# Patient Record
Sex: Male | Born: 1975 | Race: White | Hispanic: No | Marital: Married | Smoking: Never smoker
Health system: Southern US, Community
[De-identification: ages and names within clinical notes are randomized; demographics above are authoritative.]

## PROBLEM LIST (undated history)

## (undated) HISTORY — PX: THROAT SURGERY: SHX803

## (undated) HISTORY — PX: TONSILLECTOMY: SUR1361

## (undated) HISTORY — PX: WISDOM TOOTH EXTRACTION: SHX21

---

## 2007-11-17 ENCOUNTER — Inpatient Hospital Stay (HOSPITAL_COMMUNITY): Admission: RE | Admit: 2007-11-17 | Discharge: 2007-11-21 | Payer: Self-pay | Admitting: Psychiatry

## 2007-11-17 ENCOUNTER — Ambulatory Visit: Payer: Self-pay | Admitting: Psychiatry
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
## 2011-04-17 LAB — TSH: TSH: 3.465

## 2018-08-02 ENCOUNTER — Emergency Department (HOSPITAL_BASED_OUTPATIENT_CLINIC_OR_DEPARTMENT_OTHER)
Admission: EM | Admit: 2018-08-02 | Discharge: 2018-08-02 | Disposition: A | Discharge status: Home / Self Care | Payer: Commercial Managed Care - PPO | Attending: Emergency Medicine | Admitting: Emergency Medicine

## 2018-08-02 ENCOUNTER — Emergency Department (HOSPITAL_BASED_OUTPATIENT_CLINIC_OR_DEPARTMENT_OTHER): Payer: Commercial Managed Care - PPO

## 2018-08-02 ENCOUNTER — Encounter (HOSPITAL_BASED_OUTPATIENT_CLINIC_OR_DEPARTMENT_OTHER): Payer: Self-pay | Admitting: Emergency Medicine

## 2018-08-02 ENCOUNTER — Other Ambulatory Visit: Payer: Self-pay

## 2018-08-02 DIAGNOSIS — J069 Acute upper respiratory infection, unspecified: Secondary | ICD-10-CM | POA: Insufficient documentation

## 2018-08-02 DIAGNOSIS — R05 Cough: Secondary | ICD-10-CM | POA: Diagnosis present

## 2018-08-02 DIAGNOSIS — B9789 Other viral agents as the cause of diseases classified elsewhere: Secondary | ICD-10-CM

## 2018-08-02 MED ORDER — BENZONATATE 100 MG PO CAPS: 100.0000 mg | ORAL_CAPSULE | Freq: Three times a day (TID) | ORAL | 0 refills | Status: DC | PRN

## 2018-08-02 NOTE — ED Triage Notes (Signed)
Reports fever, cough, congestion.  Believes he has pneumonia.  Reports wife diagnosed with pneumonia 1 week ago.

## 2018-08-02 NOTE — ED Provider Notes (Signed)
[facility] EMERGENCY DEPARTMENT Provider Note   CSN: [id number] Arrival date & time: 08/02/18  1219     History   Chief Complaint Chief Complaint  Patient presents with  . Cough    HPI Miguel Serrano is a 43 y.o. male without significant past medical history, presenting to the emergency department with complaint of 1 week of cold symptoms.  Patient states he has a cough productive of yellow phlegm and nasal congestion.  He has been treating with OTC Alka-Seltzer cold and flu.  He reports a fever of 99.8 F.  His wife is also ill with similar symptoms, however was recently diagnosed with pneumonia 1 week ago.  His symptoms are not worsening, however they are not improving.  He denies associated sore throat, ear pain, difficulty breathing or swallowing.  The history is provided by the patient.    History reviewed. No pertinent past medical history.  There are no active problems to display for this patient.   History reviewed. No pertinent surgical history.      Home Medications    Prior to Admission medications   Medication Sig Start Date End Date Taking? Authorizing Provider  benzonatate (TESSALON) 100 MG capsule Take 1 capsule (100 mg total) by mouth 3 (three) times daily as needed for cough. 08/02/18   Fedrick Cefalu, [place] N, PA-C    Family History History reviewed. No pertinent family history.  Social History Social History   Tobacco Use  . Smoking status: Not on file  Substance Use Topics  . Alcohol use: Not on file  . Drug use: Not on file     Allergies   Patient has no known allergies.   Review of Systems Review of Systems  HENT: Positive for congestion. Negative for ear pain, sore throat, trouble swallowing and voice change.   Respiratory: Positive for cough. Negative for shortness of breath.      Physical Exam Updated Vital Signs BP (!) 170/98 (BP Location: Right Arm)   Pulse 82   Temp 98.7 F (37.1 C) (Oral)   Resp 18   Ht 6\' 3"   (1.905 m)   Wt 86.2 kg   SpO2 100%   BMI 23.75 kg/m   Physical Exam Vitals signs and nursing note reviewed.  Constitutional:      Appearance: He is well-developed. He is not ill-appearing.  HENT:     Head: Normocephalic and atraumatic.     Right Ear: Tympanic membrane and ear canal normal.     Left Ear: Tympanic membrane and ear canal normal.     Mouth/Throat:     Mouth: Mucous membranes are moist.     Pharynx: Oropharynx is clear. No oropharyngeal exudate.     Comments: Tolerating secretions Eyes:     Conjunctiva/sclera: Conjunctivae normal.  Neck:     Musculoskeletal: Normal range of motion and neck supple. No neck rigidity.  Cardiovascular:     Rate and Rhythm: Normal rate and regular rhythm.  Pulmonary:     Effort: Pulmonary effort is normal.     Breath sounds: Normal breath sounds.  Lymphadenopathy:     Cervical: No cervical adenopathy.  Neurological:     Mental Status: He is alert.  Psychiatric:        Mood and Affect: Mood normal.        Behavior: Behavior normal.      ED Treatments / Results  Labs (all labs ordered are listed, but only abnormal results are displayed) Labs Reviewed - No data  to display  EKG None  Radiology Dg Chest 2 View  Result Date: 08/02/2018 CLINICAL DATA:  Cough for 1 week.  Fever, chills, and weakness. EXAM: CHEST - 2 VIEW COMPARISON:  12/02/2015 FINDINGS: The heart size and mediastinal contours are within normal limits. Both lungs are clear. The visualized skeletal structures are unremarkable. IMPRESSION: No active cardiopulmonary disease. Electronically Signed   By: Myles RosenthalJohn  Stahl M.D.   On: 08/02/2018 12:52    Procedures Procedures (including critical care time)  Medications Ordered in ED Medications - No data to display   Initial Impression / Assessment and Plan / ED Course  I have reviewed the triage vital signs and the nursing notes.  Pertinent labs & imaging results that were available during my care of the patient were  reviewed by me and considered in my medical decision making (see chart for details).     Patients symptoms are consistent with URI, likely viral etiology. Afebrile, tolerating secretions.  Lungs clear to auscultation bilaterally. CXR negative for acute infiltrate.  Discussed that antibiotics are not indicated for viral infections. Pt will be discharged with symptomatic treatment.  Recommend PCP follow-up to recheck blood pressure.  Encourage patient to avoid guaifenesin as this can raise his blood pressure.  Verbalizes understanding and is agreeable with plan. Pt is hemodynamically stable & in NAD prior to dc.  Discussed results, findings, treatment and follow up. Patient advised of return precautions. Patient verbalized understanding and agreed with plan.  Final Clinical Impressions(s) / ED Diagnoses   Final diagnoses:  Viral URI with cough    ED Discharge Orders         Ordered    benzonatate (TESSALON) 100 MG capsule  3 times daily PRN     08/02/18 1356           Manpreet Kemmer, [place] N, [place]-C 08/02/18 1405    Pricilla LovelessGoldston, [place], MD 08/03/18 [phone]

## 2018-08-02 NOTE — Discharge Instructions (Addendum)
Your chest x-ray is normal today.   Avoid cold medications that contain guaifenesin, as this may be contributing to your high blood pressure today. You can take Tessalon every 8 hours as needed for cough. You can take tylenol or ibuprofen as needed for sore throat or fever.  Drink plenty of water.  Use saline nasal spray for congestion. Follow up with your primary care provider to discuss your visit today, as well as to recheck your blood pressure. Return to the ER for inability to swallow liquids, difficulty breathing, or new or worsening symptoms.

## 2018-08-02 NOTE — ED Notes (Signed)
Patient verbalizes understanding of discharge instructions. Opportunity for questioning and answers were provided. Armband removed by staff, pt discharged from ED.  

## 2018-08-04 ENCOUNTER — Ambulatory Visit: Payer: Self-pay | Admitting: Cardiology

## 2018-08-18 ENCOUNTER — Ambulatory Visit (INDEPENDENT_AMBULATORY_CARE_PROVIDER_SITE_OTHER): Payer: Commercial Managed Care - PPO | Admitting: Cardiology

## 2018-08-18 ENCOUNTER — Encounter: Payer: Self-pay | Admitting: Cardiology

## 2018-08-18 VITALS — BP 168/84 | HR 83 | Ht 75.0 in | Wt 192.0 lb

## 2018-08-18 DIAGNOSIS — I10 Essential (primary) hypertension: Secondary | ICD-10-CM | POA: Insufficient documentation

## 2018-08-18 DIAGNOSIS — R011 Cardiac murmur, unspecified: Secondary | ICD-10-CM

## 2018-08-18 MED ORDER — AMLODIPINE BESYLATE 5 MG PO TABS: 5.0000 mg | ORAL_TABLET | Freq: Every day | ORAL | 1 refills | Status: DC

## 2018-08-18 NOTE — Progress Notes (Signed)
Cardiology Office Note:    Date:  08/18/2018   ID:  Miguel Serrano, DOB [birth year], MRN [id number]  PCP:  Baldo Ash, FNP  Cardiologist:  Garwin Brothers, MD   Referring MD: Baldo Ash, FNP    ASSESSMENT:    1. Essential hypertension   2. Cardiac murmur    PLAN:    In order of problems listed above:  1. Primary prevention stressed with the patient.  Importance of compliance with diet and medication stressed and he vocalized understanding.  Currently is on no medications for blood pressure.  Salt intake issues were discussed.  I told him to keep a track of his blood pressures at home 2 or 3 times a day and bring [place] those readings 2 to 3 weeks when he comes to see [place] in follow-up. 2. Echocardiogram will be done to assess murmur heard on auscultation. 3. Patient will be initiated on amlodipine 5 mg daily.  Benefits and potential risks explained.  Were trying to get records from primary care doctor as we have not received any yet.   Medication Adjustments/Labs and Tests Ordered: Current medicines are reviewed at length with the patient today.  Concerns regarding medicines are outlined above.  Orders Placed This Encounter  Procedures  . ECHOCARDIOGRAM COMPLETE   Meds ordered this encounter  Medications  . amLODipine (NORVASC) 5 MG tablet    Sig: Take 1 tablet (5 mg total) by mouth daily.    Dispense:  30 tablet    Refill:  1     History of Present Illness:    Miguel Serrano is a 43 y.o. male who is being seen today for the evaluation of essential hypertension at the request of Baldo Ash, FNP.  Patient is a pleasant 43 year old male.  He has past medical history that is not very significant.  He went to his primary care physician and was told to have high blood pressure and referred here.  He leads a sedentary lifestyle.  He does not give any history of chest pain orthopnea or PND.  He does not exercise on a regular basis.  At the time of my evaluation, the  patient is alert awake oriented and in no distress.  History reviewed. No pertinent past medical history.  Past Surgical History:  Procedure Laterality Date  . THROAT SURGERY    . TONSILLECTOMY    . WISDOM TOOTH EXTRACTION      Current Medications: Current Meds  Medication Sig  . benzonatate (TESSALON) 100 MG capsule Take 1 capsule (100 mg total) by mouth 3 (three) times daily as needed for cough.  Marland Kitchen buPROPion (WELLBUTRIN XL) 150 MG 24 hr tablet Take 1 tablet by mouth daily.  . busPIRone (BUSPAR) 10 MG tablet Take 1 tablet by mouth 2 (two) times daily.  . propranolol (INDERAL) 40 MG tablet Take 1 tablet by mouth 2 (two) times daily.  . SUMAtriptan (IMITREX) 50 MG tablet Take 50 mg by mouth every 2 (two) hours as needed for migraine. May repeat in 2 hours if headache persists or recurs.     Allergies:   Patient has no known allergies.   Social History   Socioeconomic History  . Marital status: Married    Spouse name: Not on file  . Number of children: Not on file  . Years of education: Not on file  . Highest education level: Not on file  Occupational History  . Not on file  Social Needs  . Financial resource strain: Not  on file  . Food insecurity:    Worry: Not on file    Inability: Not on file  . Transportation needs:    Medical: Not on file    Non-medical: Not on file  Tobacco Use  . Smoking status: Never Smoker  . Smokeless tobacco: Never Used  Substance and Sexual Activity  . Alcohol use: Not on file  . Drug use: Not on file  . Sexual activity: Not on file  Lifestyle  . Physical activity:    Days per week: Not on file    Minutes per session: Not on file  . Stress: Not on file  Relationships  . Social connections:    Talks on phone: Not on file    Gets together: Not on file    Attends religious service: Not on file    Active member of club or organization: Not on file    Attends meetings of clubs or organizations: Not on file    Relationship status: Not  on file  Other Topics Concern  . Not on file  Social History Narrative  . Not on file     Family History: The patient's family history is not on file.  ROS:   Please see the history of present illness.    All other systems reviewed and are negative.  EKGs/Labs/Other Studies Reviewed:    The following studies were reviewed today: EKG reveals sinus rhythm and nonspecific ST-T changes   Recent Labs: No results found for requested labs within last 8760 hours.  Recent Lipid Panel No results found for: CHOL, TRIG, HDL, CHOLHDL, VLDL, LDLCALC, LDLDIRECT  Physical Exam:    VS:  BP (!) 168/84 (BP Location: Right Arm, Patient Position: Sitting, Cuff Size: Normal)   Pulse 83   Ht 6\' 3"  (1.905 m)   Wt 192 lb (87.1 kg)   SpO2 98%   BMI 24.00 kg/m     Wt Readings from Last 3 Encounters:  08/18/18 192 lb (87.1 kg)  08/02/18 190 lb (86.2 kg)     GEN: Patient is in no acute distress HEENT: Normal NECK: No JVD; No carotid bruits LYMPHATICS: No lymphadenopathy CARDIAC: S1 S2 regular, 2/6 systolic murmur at the apex. RESPIRATORY:  Clear to auscultation without rales, wheezing or rhonchi  ABDOMEN: Soft, non-tender, non-distended MUSCULOSKELETAL:  No edema; No deformity  SKIN: Warm and dry NEUROLOGIC:  Alert and oriented x 3 PSYCHIATRIC:  Normal affect    Signed, Garwin Brothers, MD  08/18/2018 4:25 PM    [facility]

## 2018-08-18 NOTE — Addendum Note (Signed)
Addended by: Carren Rang on: 08/18/2018 04:47 PM   Modules accepted: Orders

## 2018-08-18 NOTE — Patient Instructions (Addendum)
Medication Instructions:  Your physician has recommended you make the following change in your medication:   START:  Amlodipine 5mg  one tablet daily.     If you need a refill on your cardiac medications before your next appointment, please call your pharmacy.    Your physician has requested that you regularly monitor and record your blood pressure readings at home. Please use the same machine at the same time of day to check your readings and record them to bring to your follow-up visit.   Lab work: NONE If you have labs (blood work) drawn today and your tests are completely normal, you will receive your results only by: Marland Kitchen. MyChart Message (if you have MyChart) OR . A paper copy in the mail If you have any lab test that is abnormal or we need to change your treatment, we will call you to review the results.  Testing/Procedures: Your physician has requested that you have an echocardiogram. Echocardiography is a painless test that uses sound waves to create images of your heart. It provides your doctor with information about the size and shape of your heart and how well your heart's chambers and valves are working. This procedure takes approximately one hour. There are no restrictions for this procedure.    Follow-Up: [facility], you and your health needs are our priority.  As part of our continuing mission to provide you with exceptional heart care, we have created designated Provider Care Teams.  These Care Teams include your primary Cardiologist (physician) and Advanced Practice Providers (APPs -  Physician Assistants and Nurse Practitioners) who all work together to provide you with the care you need, when you need it. You will need a follow up appointment in 2 - 3 weeks with Dr Tomie Chinaevankar.    Amlodipine tablets What is this medicine? AMLODIPINE (am LOE di peen) is a calcium-channel blocker. It affects the amount of calcium found in your heart and muscle cells. This relaxes your  blood vessels, which can reduce the amount of work the heart has to do. This medicine is used to lower high blood pressure. It is also used to prevent chest pain. This medicine may be used for other purposes; ask your health care provider or pharmacist if you have questions. COMMON BRAND NAME(S): Norvasc What should I tell my health care provider before I take this medicine? They need to know if you have any of these conditions: -heart disease -liver disease -an unusual or allergic reaction to amlodipine, other medicines, foods, dyes, or preservatives -pregnant or trying to get pregnant -breast-feeding How should I use this medicine? Take this medicine by mouth with a glass of water. Follow the directions on the prescription label. You can take it with or without food. If it upsets your stomach, take it with food. Take your medicine at regular intervals. Do not take it more often than directed. Do not stop taking except on your doctor's advice. Talk to your pediatrician regarding the use of this medicine in children. While this drug may be prescribed for children as young as 6 years for selected conditions, precautions do apply. Patients over 45 years of age may have a stronger reaction and need a smaller dose. Overdosage: If you think you have taken too much of this medicine contact a poison control center or emergency room at once. NOTE: This medicine is only for you. Do not share this medicine with others. What if I miss a dose? If you miss a dose, take it  as soon as you can. If it is almost time for your next dose, take only that dose. Do not take double or extra doses. What may interact with this medicine? Do not take this medicine with any of the following medications: -tranylcypromine This medicine may also interact with the following medications: -clarithromycin -cyclosporine -diltiazem -itraconazole -simvastatin -tacrolimus This list may not describe all possible interactions.  Give your health care provider a list of all the medicines, herbs, non-prescription drugs, or dietary supplements you use. Also tell them if you smoke, drink alcohol, or use illegal drugs. Some items may interact with your medicine. What should I watch for while using this medicine? Visit your healthcare professional for regular checks on your progress. Check your blood pressure as directed. Ask your healthcare professional what your blood pressure should be and when you should contact him or her. Do not treat yourself for coughs, colds, or pain while you are using this medicine without asking your healthcare professional for advice. Some medicines may increase your blood pressure. You may get dizzy. Do not drive, use machinery, or do anything that needs mental alertness until you know how this medicine affects you. Do not stand or sit up quickly, especially if you are an older patient. This reduces the risk of dizzy or fainting spells. Avoid alcoholic drinks; they can make you dizzier. What side effects may I notice from receiving this medicine? Side effects that you should report to your doctor or health care professional as soon as possible: -allergic reactions like skin rash, itching or hives; swelling of the face, lips, or tongue -fast, irregular heartbeat -signs and symptoms of low blood pressure like dizziness; feeling faint or lightheaded, falls; unusually weak or tired -swelling of ankles, feet, hands Side effects that usually do not require medical attention (report these to your doctor or health care professional if they continue or are bothersome): -dry mouth -facial flushing -headache -stomach pain -tiredness This list may not describe all possible side effects. Call your doctor for medical advice about side effects. You may report side effects to FDA at 1-800-FDA-1088. Where should I keep my medicine? Keep out of the reach of children. Store at room temperature between 59 and 86  degrees F (15 and 30 degrees C). Throw away any unused medicine after the expiration date. NOTE: This sheet is a summary. It may not cover all possible information. If you have questions about this medicine, talk to your doctor, pharmacist, or health care provider.  2019 Elsevier/Gold Standard (2018-01-31 15:07:10)

## 2018-08-25 ENCOUNTER — Other Ambulatory Visit: Payer: Commercial Managed Care - PPO

## 2018-08-25 ENCOUNTER — Ambulatory Visit (INDEPENDENT_AMBULATORY_CARE_PROVIDER_SITE_OTHER): Payer: Commercial Managed Care - PPO

## 2018-08-25 DIAGNOSIS — I10 Essential (primary) hypertension: Secondary | ICD-10-CM | POA: Diagnosis not present

## 2018-08-25 DIAGNOSIS — R011 Cardiac murmur, unspecified: Secondary | ICD-10-CM | POA: Diagnosis not present

## 2018-08-25 NOTE — Progress Notes (Signed)
Complete echocardiogram has been performed.  Jimmy Koleman Marling RDCS, RVT 

## 2018-08-25 NOTE — Progress Notes (Deleted)
Complete echocardiogram has been performed.  Jimmy Kieran Arreguin RDCS, RVT 

## 2018-09-02 ENCOUNTER — Encounter: Payer: Self-pay | Admitting: Cardiology

## 2018-09-02 ENCOUNTER — Ambulatory Visit (INDEPENDENT_AMBULATORY_CARE_PROVIDER_SITE_OTHER): Payer: Commercial Managed Care - PPO | Admitting: Cardiology

## 2018-09-02 VITALS — BP 140/72 | HR 103 | Ht 75.0 in | Wt 193.0 lb

## 2018-09-02 DIAGNOSIS — I10 Essential (primary) hypertension: Secondary | ICD-10-CM

## 2018-09-02 NOTE — Progress Notes (Signed)
Cardiology Office Note:    Date:  09/02/2018   ID:  Miguel Serrano, DOB [birth year], MRN [id number]  PCP:  Baldo AshMartin, Michael, FNP  Cardiologist:  Garwin Brothersajan R Husna Krone, MD   Referring MD: Baldo AshMartin, Michael, FNP    ASSESSMENT:    1. Essential hypertension    PLAN:    In order of problems listed above:  1. Primary prevention stressed with the patient.  Importance of compliance with diet and medication stressed and he vocalized understanding.  I advised him to continue a low-salt diet and a healthy lifestyle including brisk walking half an hour daily at least 5 times a week.  With this he has no symptoms.  His lipids will be followed by his primary care physician. 2. Patient will be seen in follow-up appointment in 6 months or earlier if the patient has any concerns    Medication Adjustments/Labs and Tests Ordered: Current medicines are reviewed at length with the patient today.  Concerns regarding medicines are outlined above.  No orders of the defined types were placed in this encounter.  No orders of the defined types were placed in this encounter.    No chief complaint on file.    History of Present Illness:    Miguel Serrano is a 43 y.o. male.  He was evaluated by me for essential hypertension.  He denies any problems at the care of activities of daily living.  No chest pain orthopnea the patient mentions to me that his blood pressure is much better now and he is keeping a track of it.  History reviewed. No pertinent past medical history.  Past Surgical History:  Procedure Laterality Date  . THROAT SURGERY    . TONSILLECTOMY    . WISDOM TOOTH EXTRACTION      Current Medications: Current Meds  Medication Sig  . amLODipine (NORVASC) 5 MG tablet Take 1 tablet (5 mg total) by mouth daily.  . benzonatate (TESSALON) 100 MG capsule Take 1 capsule (100 mg total) by mouth 3 (three) times daily as needed for cough.  Marland Kitchen. buPROPion (WELLBUTRIN XL) 150 MG 24 hr tablet Take 1 tablet by  mouth daily.  . busPIRone (BUSPAR) 10 MG tablet Take 1 tablet by mouth 2 (two) times daily.  . SUMAtriptan (IMITREX) 50 MG tablet Take 50 mg by mouth every 2 (two) hours as needed for migraine. May repeat in 2 hours if headache persists or recurs.     Allergies:   Patient has no known allergies.   Social History   Socioeconomic History  . Marital status: Married    Spouse name: Not on file  . Number of children: Not on file  . Years of education: Not on file  . Highest education level: Not on file  Occupational History  . Not on file  Social Needs  . Financial resource strain: Not on file  . Food insecurity:    Worry: Not on file    Inability: Not on file  . Transportation needs:    Medical: Not on file    Non-medical: Not on file  Tobacco Use  . Smoking status: Never Smoker  . Smokeless tobacco: Never Used  Substance and Sexual Activity  . Alcohol use: Not on file  . Drug use: Not on file  . Sexual activity: Not on file  Lifestyle  . Physical activity:    Days per week: Not on file    Minutes per session: Not on file  . Stress: Not on file  Relationships  . Social connections:    Talks on phone: Not on file    Gets together: Not on file    Attends religious service: Not on file    Active member of club or organization: Not on file    Attends meetings of clubs or organizations: Not on file    Relationship status: Not on file  Other Topics Concern  . Not on file  Social History Narrative  . Not on file     Family History: The patient's family history is not on file.  ROS:   Please see the history of present illness.    All other systems reviewed and are negative.  EKGs/Labs/Other Studies Reviewed:    The following studies were reviewed today: I discussed my findings with the patient of the echocardiogram at length.  It looked unremarkable.   Recent Labs: No results found for requested labs within last 8760 hours.  Recent Lipid Panel No results found  for: CHOL, TRIG, HDL, CHOLHDL, VLDL, LDLCALC, LDLDIRECT  Physical Exam:    VS:  BP 140/72 (BP Location: Right Arm, Patient Position: Sitting, Cuff Size: Normal)   Pulse (!) 103   Ht 6\' 3"  (1.905 m)   Wt 193 lb (87.5 kg)   SpO2 96%   BMI 24.12 kg/m     Wt Readings from Last 3 Encounters:  09/02/18 193 lb (87.5 kg)  08/18/18 192 lb (87.1 kg)  08/02/18 190 lb (86.2 kg)     GEN: Patient is in no acute distress HEENT: Normal NECK: No JVD; No carotid bruits LYMPHATICS: No lymphadenopathy CARDIAC: Hear sounds regular, 2/6 systolic murmur at the apex. RESPIRATORY:  Clear to auscultation without rales, wheezing or rhonchi  ABDOMEN: Soft, non-tender, non-distended MUSCULOSKELETAL:  No edema; No deformity  SKIN: Warm and dry NEUROLOGIC:  Alert and oriented x 3 PSYCHIATRIC:  Normal affect   Signed, Garwin Brothersajan R Joanette Silveria, MD  09/02/2018 4:50 PM    [facility]

## 2018-09-02 NOTE — Patient Instructions (Signed)
Medication Instructions:  Your physician recommends that you continue on your current medications as directed. Please refer to the Current Medication list given to you today.  If you need a refill on your cardiac medications before your next appointment, please call your pharmacy.   Lab work: None If you have labs (blood work) drawn today and your tests are completely normal, you will receive your results only by: . MyChart Message (if you have MyChart) OR . A paper copy in the mail If you have any lab test that is abnormal or we need to change your treatment, we will call you to review the results.  Testing/Procedures: None  Follow-Up: At [facility], you and your health needs are our priority.  As part of our continuing mission to provide you with exceptional heart care, we have created designated Provider Care Teams.  These Care Teams include your primary Cardiologist (physician) and Advanced Practice Providers (APPs -  Physician Assistants and Nurse Practitioners) who all work together to provide you with the care you need, when you need it. You will need a follow up appointment in 6 months.  Please call our office 2 months in advance to schedule this appointment.  You may see No primary care provider on file. or another member of our [facility] Provider Team in [place]: Robert Krasowski, MD . Brian Munley, MD  Any Other Special Instructions Will Be Listed Below (If Applicable).    

## 2019-07-03 ENCOUNTER — Encounter: Payer: Self-pay | Admitting: Cardiology

## 2019-07-03 ENCOUNTER — Ambulatory Visit (INDEPENDENT_AMBULATORY_CARE_PROVIDER_SITE_OTHER): Payer: Commercial Managed Care - PPO | Admitting: Cardiology

## 2019-07-03 ENCOUNTER — Other Ambulatory Visit: Payer: Self-pay

## 2019-07-03 VITALS — BP 136/78 | HR 89 | Ht 75.0 in | Wt 190.2 lb

## 2019-07-03 DIAGNOSIS — I10 Essential (primary) hypertension: Secondary | ICD-10-CM

## 2019-07-03 NOTE — Patient Instructions (Signed)
Medication Instructions:  Your physician recommends that you continue on your current medications as directed. Please refer to the Current Medication list given to you today.  *If you need a refill on your cardiac medications before your next appointment, please call your pharmacy*  Lab Work: Your physician recommends that you return for lab work today: bmp, cbc, tsh, lft, lipids   If you have labs (blood work) drawn today and your tests are completely normal, you will receive your results only by: Marland Kitchen MyChart Message (if you have MyChart) OR . A paper copy in the mail If you have any lab test that is abnormal or we need to change your treatment, we will call you to review the results.  Testing/Procedures: None.   Follow-Up: At [facility], you and your health needs are our priority.  As part of our continuing mission to provide you with exceptional heart care, we have created designated Provider Care Teams.  These Care Teams include your primary Cardiologist (physician) and Advanced Practice Providers (APPs -  Physician Assistants and Nurse Practitioners) who all work together to provide you with the care you need, when you need it.  Your next appointment:   1 year(s)  The format for your next appointment:   In Person  Provider:   Jyl Heinz, MD  Other Instructions

## 2019-07-03 NOTE — Progress Notes (Signed)
Cardiology Office Note:    Date:  07/03/2019   ID:  Miguel Serrano, DOB October 26, 1975, MRN [id number]  PCP:  Baldo Ash, FNP  Cardiologist:  Garwin Brothers, MD   Referring MD: Baldo Ash, FNP    ASSESSMENT:    No diagnosis found. PLAN:    In order of problems listed above:  1. Elevated blood pressure without a diagnosis of hypertension: I discussed my findings with the patient extensively.  He has put some lifestyle modification in place and this is helped his blood pressure.  I explained to him the importance of regular exercise and he verbalized understanding.  Also mentioned about salt issues and diet.  He promises to comply with it.  He will be back in the next few days for complete blood work including fasting lipids.  He has not had that for the past several months.  At the time of my evaluation, the patient is alert awake oriented and in no distress. 2. Patient will be seen in follow-up appointment in 12 months or earlier if the patient has any concerns    Medication Adjustments/Labs and Tests Ordered: Current medicines are reviewed at length with the patient today.  Concerns regarding medicines are outlined above.  No orders of the defined types were placed in this encounter.  No orders of the defined types were placed in this encounter.    Chief Complaint  Patient presents with  . Follow-up     History of Present Illness:    Miguel Serrano is a 43 y.o. male.  Patient was evaluated by me for essential hypertension.  He denies any problems at this time and takes care of activities of daily living.  No chest pain orthopnea or PND.  He leads a sedentary lifestyle.  At the time of my evaluation, the patient is alert awake oriented and in no distress.  No past medical history on file.  Past Surgical History:  Procedure Laterality Date  . THROAT SURGERY    . TONSILLECTOMY    . WISDOM TOOTH EXTRACTION      Current Medications: No outpatient medications  have been marked as taking for the 07/03/19 encounter (Office Visit) with Oronde Hallenbeck, Aundra Dubin, MD.     Allergies:   Patient has no known allergies.   Social History   Socioeconomic History  . Marital status: Married    Spouse name: Not on file  . Number of children: Not on file  . Years of education: Not on file  . Highest education level: Not on file  Occupational History  . Not on file  Tobacco Use  . Smoking status: Never Smoker  . Smokeless tobacco: Never Used  Substance and Sexual Activity  . Alcohol use: Yes    Comment: every once in a while  . Drug use: Never  . Sexual activity: Not on file  Other Topics Concern  . Not on file  Social History Narrative  . Not on file   Social Determinants of Health   Financial Resource Strain:   . Difficulty of Paying Living Expenses: Not on file  Food Insecurity:   . Worried About Programme researcher, broadcasting/film/video in the Last Year: Not on file  . Ran Out of Food in the Last Year: Not on file  Transportation Needs:   . Lack of Transportation (Medical): Not on file  . Lack of Transportation (Non-Medical): Not on file  Physical Activity:   . Days of Exercise per Week: Not on file  .  Minutes of Exercise per Session: Not on file  Stress:   . Feeling of Stress : Not on file  Social Connections:   . Frequency of Communication with Friends and Family: Not on file  . Frequency of Social Gatherings with Friends and Family: Not on file  . Attends Religious Services: Not on file  . Active Member of Clubs or Organizations: Not on file  . Attends Archivist Meetings: Not on file  . Marital Status: Not on file     Family History: The patient's family history includes Healthy in his father.  ROS:   Please see the history of present illness.    All other systems reviewed and are negative.  EKGs/Labs/Other Studies Reviewed:    The following studies were reviewed today: IMPRESSIONS    1. The left ventricle has normal systolic  function of 50-93%. The cavity size is normal. There is borderline left ventricular wall thickness. Echo evidence of impaired relaxation diastolic filling patterns.  2. Normal left atrial size.  3. Normal right atrial size.  4. Normal tricuspid valve.  5. Pulmonic valve regurgitation is mild by color flow Doppler.  6. No atrial level shunt detected by color flow Doppler.   Recent Labs: No results found for requested labs within last 8760 hours.  Recent Lipid Panel No results found for: CHOL, TRIG, HDL, CHOLHDL, VLDL, LDLCALC, LDLDIRECT  Physical Exam:    VS:  BP 136/78   Pulse 89   Ht 6\' 3"  (1.905 m)   Wt 190 lb 3.2 oz (86.3 kg)   SpO2 96%   BMI 23.77 kg/m     Wt Readings from Last 3 Encounters:  07/03/19 190 lb 3.2 oz (86.3 kg)  09/02/18 193 lb (87.5 kg)  08/18/18 192 lb (87.1 kg)     GEN: Patient is in no acute distress HEENT: Normal NECK: No JVD; No carotid bruits LYMPHATICS: No lymphadenopathy CARDIAC: Hear sounds regular, 2/6 systolic murmur at the apex. RESPIRATORY:  Clear to auscultation without rales, wheezing or rhonchi  ABDOMEN: Soft, non-tender, non-distended MUSCULOSKELETAL:  No edema; No deformity  SKIN: Warm and dry NEUROLOGIC:  Alert and oriented x 3 PSYCHIATRIC:  Normal affect   Signed, Jenean Lindau, MD  07/03/2019 4:24 PM    [facility]

## 2019-12-12 IMAGING — CR DG CHEST 2V
2 series · 2 of 2 positions shown · non-contrast
Comparison: 12/02/2015

CLINICAL DATA: Cough for 1 week.  Fever, chills, and weakness.

EXAM:
CHEST - 2 VIEW

[w chest pa]
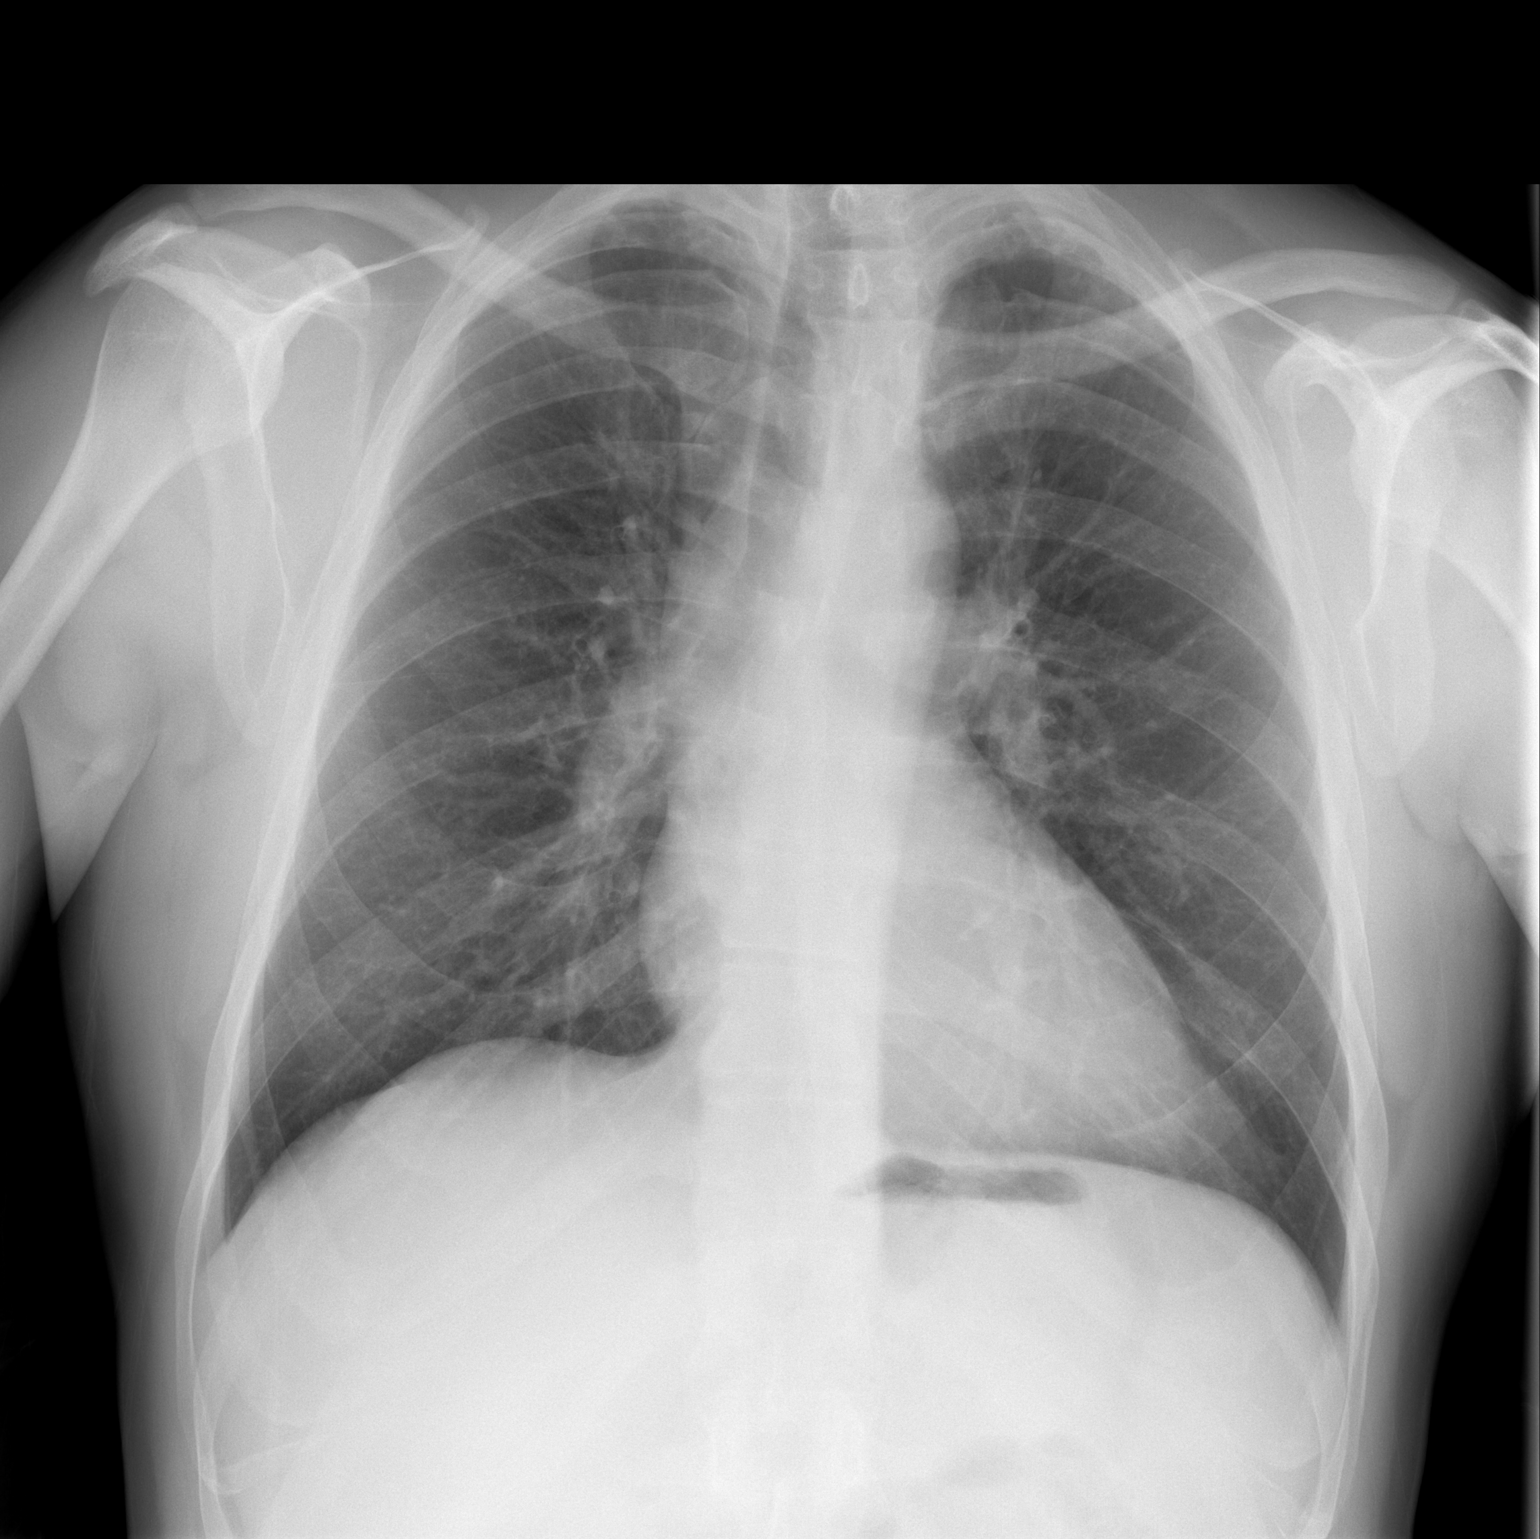

[w chest lat]
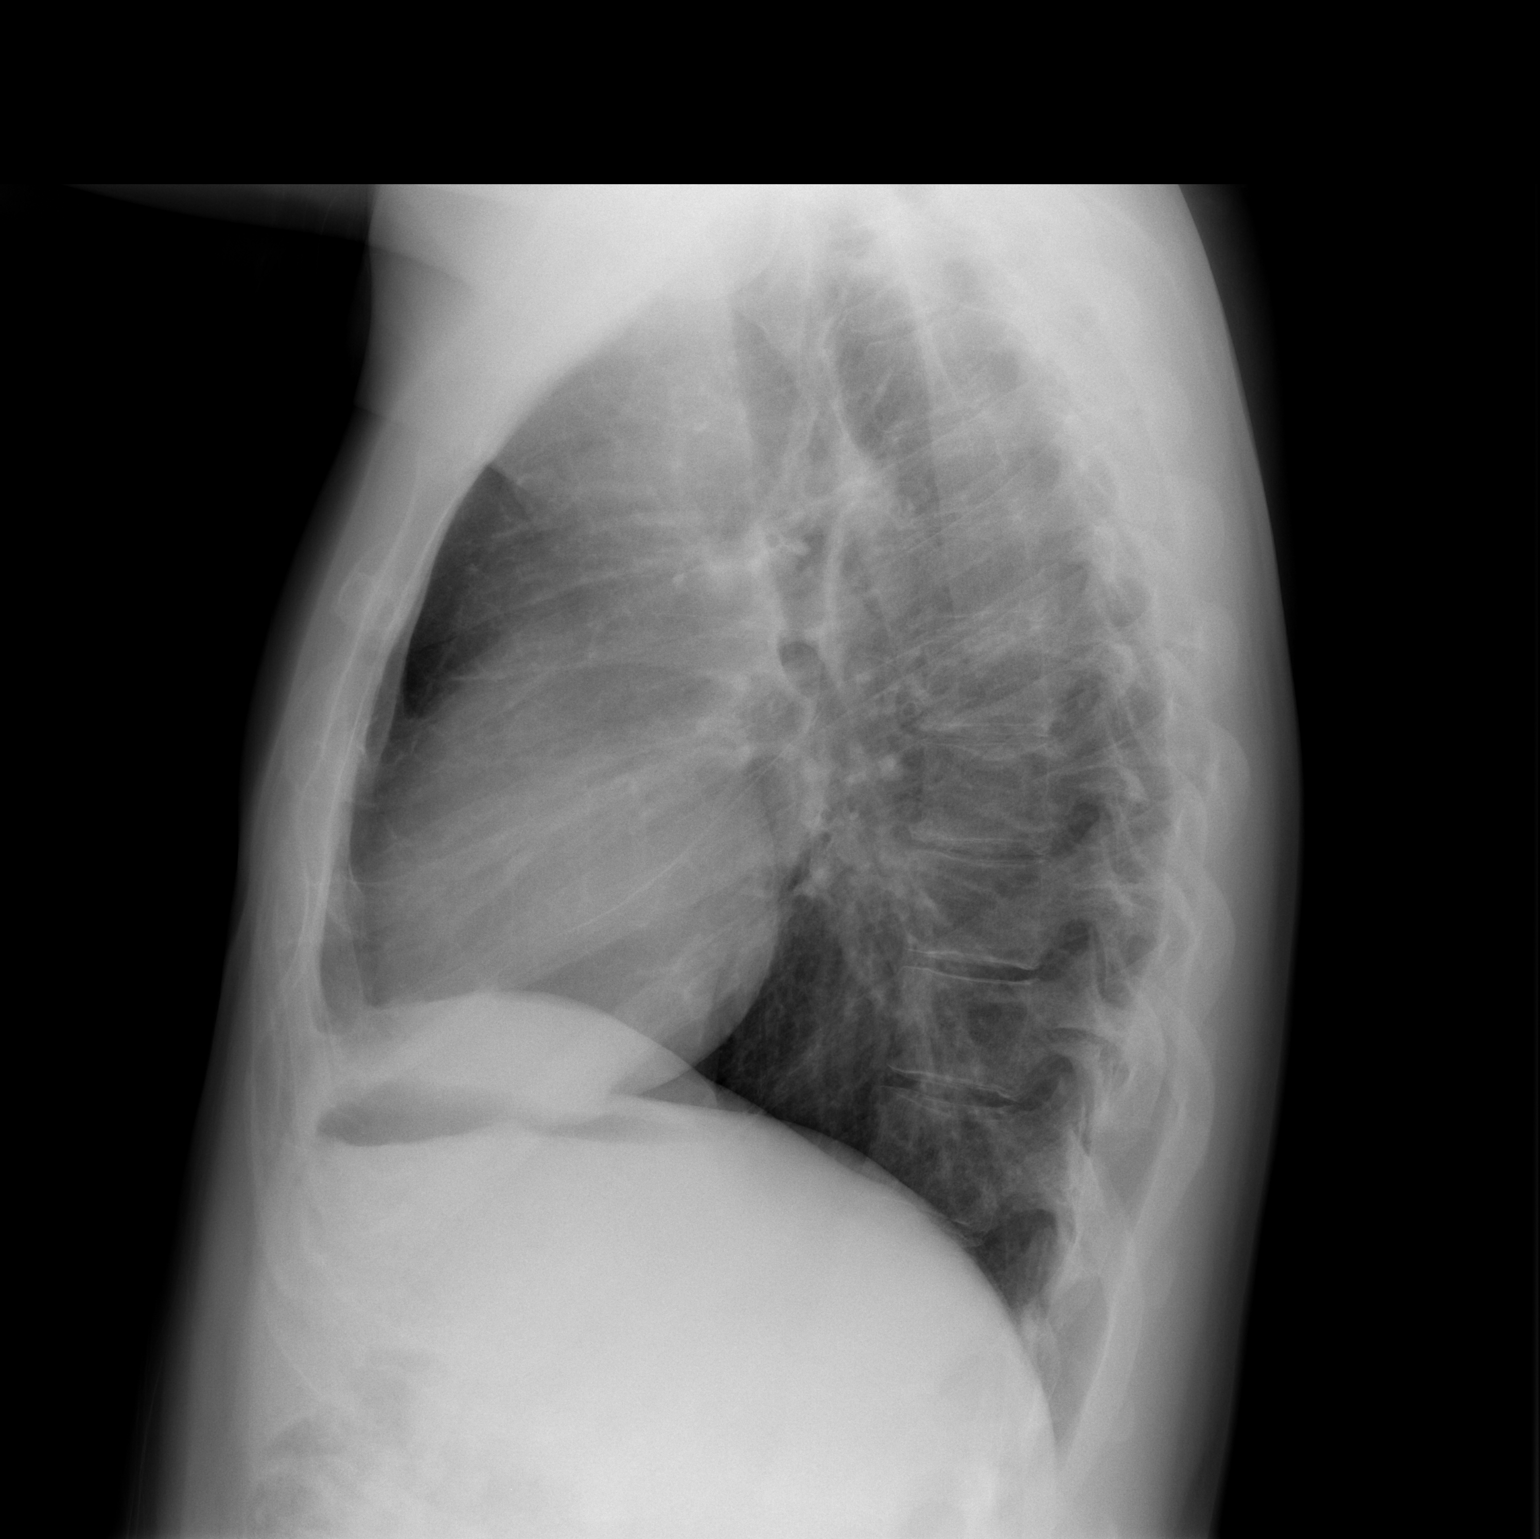

[2 of 2 positions shown; findings below may reference images not displayed]

FINDINGS: The heart size and mediastinal contours are within normal limits.
Both lungs are clear. The visualized skeletal structures are
unremarkable.
IMPRESSION: No active cardiopulmonary disease.

## 2020-08-23 DEATH — deceased

## 2023-10-23 DIAGNOSIS — Z87442 Personal history of urinary calculi: Secondary | ICD-10-CM | POA: Diagnosis not present

## 2023-10-23 DIAGNOSIS — R351 Nocturia: Secondary | ICD-10-CM | POA: Diagnosis not present

## 2023-10-23 DIAGNOSIS — R339 Retention of urine, unspecified: Secondary | ICD-10-CM | POA: Diagnosis not present

## 2023-12-10 DIAGNOSIS — G8929 Other chronic pain: Secondary | ICD-10-CM | POA: Diagnosis not present

## 2023-12-10 DIAGNOSIS — K2270 Barrett's esophagus without dysplasia: Secondary | ICD-10-CM | POA: Diagnosis not present

## 2023-12-10 DIAGNOSIS — Z202 Contact with and (suspected) exposure to infections with a predominantly sexual mode of transmission: Secondary | ICD-10-CM | POA: Diagnosis not present

## 2023-12-10 DIAGNOSIS — F908 Attention-deficit hyperactivity disorder, other type: Secondary | ICD-10-CM | POA: Diagnosis not present

## 2023-12-10 DIAGNOSIS — I10 Essential (primary) hypertension: Secondary | ICD-10-CM | POA: Diagnosis not present

## 2023-12-10 DIAGNOSIS — M25561 Pain in right knee: Secondary | ICD-10-CM | POA: Diagnosis not present

## 2023-12-10 DIAGNOSIS — K219 Gastro-esophageal reflux disease without esophagitis: Secondary | ICD-10-CM | POA: Diagnosis not present

## 2024-01-08 DIAGNOSIS — M542 Cervicalgia: Secondary | ICD-10-CM | POA: Diagnosis not present

## 2024-01-08 DIAGNOSIS — M25512 Pain in left shoulder: Secondary | ICD-10-CM | POA: Diagnosis not present

## 2024-04-23 DIAGNOSIS — M25561 Pain in right knee: Secondary | ICD-10-CM | POA: Diagnosis not present

## 2024-04-23 DIAGNOSIS — G8929 Other chronic pain: Secondary | ICD-10-CM | POA: Diagnosis not present

## 2024-04-23 DIAGNOSIS — M25512 Pain in left shoulder: Secondary | ICD-10-CM | POA: Diagnosis not present
